# Patient Record
Sex: Female | Born: 1955 | Race: White | Hispanic: No | Marital: Single | Smoking: Former smoker
Health system: Southern US, Community
[De-identification: ages and names within clinical notes are randomized; demographics above are authoritative.]

## PROBLEM LIST (undated history)

## (undated) DIAGNOSIS — G43909 Migraine, unspecified, not intractable, without status migrainosus: Secondary | ICD-10-CM

## (undated) DIAGNOSIS — F419 Anxiety disorder, unspecified: Secondary | ICD-10-CM

## (undated) DIAGNOSIS — D649 Anemia, unspecified: Secondary | ICD-10-CM

## (undated) DIAGNOSIS — T7840XA Allergy, unspecified, initial encounter: Secondary | ICD-10-CM

## (undated) DIAGNOSIS — G4730 Sleep apnea, unspecified: Secondary | ICD-10-CM

## (undated) DIAGNOSIS — M797 Fibromyalgia: Secondary | ICD-10-CM

## (undated) DIAGNOSIS — K8020 Calculus of gallbladder without cholecystitis without obstruction: Secondary | ICD-10-CM

## (undated) DIAGNOSIS — M810 Age-related osteoporosis without current pathological fracture: Secondary | ICD-10-CM

## (undated) DIAGNOSIS — M1990 Unspecified osteoarthritis, unspecified site: Secondary | ICD-10-CM

## (undated) DIAGNOSIS — K589 Irritable bowel syndrome without diarrhea: Secondary | ICD-10-CM

## (undated) DIAGNOSIS — C801 Malignant (primary) neoplasm, unspecified: Secondary | ICD-10-CM

## (undated) DIAGNOSIS — E7800 Pure hypercholesterolemia, unspecified: Secondary | ICD-10-CM

## (undated) DIAGNOSIS — K602 Anal fissure, unspecified: Secondary | ICD-10-CM

## (undated) DIAGNOSIS — E785 Hyperlipidemia, unspecified: Secondary | ICD-10-CM

## (undated) HISTORY — DX: Age-related osteoporosis without current pathological fracture: M81.0

## (undated) HISTORY — DX: Fibromyalgia: M79.7

## (undated) HISTORY — DX: Anemia, unspecified: D64.9

## (undated) HISTORY — DX: Anxiety disorder, unspecified: F41.9

## (undated) HISTORY — PX: TOE SURGERY: SHX1073

## (undated) HISTORY — PX: CYSTECTOMY: SUR359

## (undated) HISTORY — DX: Sleep apnea, unspecified: G47.30

## (undated) HISTORY — DX: Migraine, unspecified, not intractable, without status migrainosus: G43.909

## (undated) HISTORY — DX: Malignant (primary) neoplasm, unspecified: C80.1

## (undated) HISTORY — DX: Unspecified osteoarthritis, unspecified site: M19.90

## (undated) HISTORY — DX: Allergy, unspecified, initial encounter: T78.40XA

## (undated) HISTORY — DX: Anal fissure, unspecified: K60.2

## (undated) HISTORY — DX: Hyperlipidemia, unspecified: E78.5

## (undated) HISTORY — DX: Pure hypercholesterolemia, unspecified: E78.00

## (undated) HISTORY — DX: Irritable bowel syndrome, unspecified: K58.9

## (undated) HISTORY — DX: Calculus of gallbladder without cholecystitis without obstruction: K80.20

---

## 1997-07-13 ENCOUNTER — Ambulatory Visit (HOSPITAL_COMMUNITY): Admission: RE | Admit: 1997-07-13 | Discharge: 1997-07-13 | Payer: Self-pay | Admitting: *Deleted

## 1998-08-22 ENCOUNTER — Encounter: Payer: Self-pay | Admitting: Family Medicine

## 1998-08-22 ENCOUNTER — Ambulatory Visit (HOSPITAL_COMMUNITY): Admission: RE | Admit: 1998-08-22 | Discharge: 1998-08-22 | Payer: Self-pay | Admitting: Family Medicine

## 2003-05-01 ENCOUNTER — Encounter: Admission: RE | Admit: 2003-05-01 | Discharge: 2003-05-01 | Payer: Self-pay | Admitting: Obstetrics and Gynecology

## 2010-03-03 ENCOUNTER — Encounter: Payer: Self-pay | Admitting: Obstetrics and Gynecology

## 2016-05-11 ENCOUNTER — Encounter (HOSPITAL_COMMUNITY): Payer: Self-pay

## 2016-05-11 ENCOUNTER — Emergency Department (HOSPITAL_COMMUNITY)
Admission: EM | Admit: 2016-05-11 | Discharge: 2016-05-11 | Disposition: A | Discharge status: Home / Self Care | Payer: BLUE CROSS/BLUE SHIELD | Attending: Emergency Medicine | Admitting: Emergency Medicine

## 2016-05-11 DIAGNOSIS — Z87891 Personal history of nicotine dependence: Secondary | ICD-10-CM | POA: Diagnosis not present

## 2016-05-11 DIAGNOSIS — H43391 Other vitreous opacities, right eye: Secondary | ICD-10-CM | POA: Diagnosis not present

## 2016-05-11 DIAGNOSIS — H538 Other visual disturbances: Secondary | ICD-10-CM | POA: Diagnosis not present

## 2016-05-11 MED ORDER — TETRACAINE HCL 0.5 % OP SOLN
1.0000 [drp] | Freq: Once | OPHTHALMIC | Status: AC
Start: 2016-05-11 — End: 2016-05-11
  Administered 2016-05-11: 1 [drp] via OPHTHALMIC
  Filled 2016-05-11: qty 4

## 2016-05-11 NOTE — Discharge Instructions (Signed)
Please call tomorrow and schedule a follow-up appointment with Dr. Cathey Endow.

## 2016-05-11 NOTE — ED Provider Notes (Signed)
[facility]-EMERGENCY DEPT Provider Note   CSN: [id number] Arrival date & time: 05/11/16  1743     History   Chief Complaint Chief Complaint  Patient presents with  . Head Injury    hit head in the temp area with a hair dryer 2 days ago.  . eye problems    HPI Tricia Osborne is a 61 y.o. female who presents with constant floaters and intermittent flashes in the right eye that began last night and are worse when she looks upward. She reports the eye is not painful; no HA, diplopia or blurred vision. No alleviating or aggravating factors. She reports that she hit her right forehead with her hair dryer two days ago while drying her hair, but denies any other recent trauma or injury. No changes to the left eye. She wears glasses at baseline. No history of HTN or DM. She is otherwise healthy and takes no daily medications.   HPI  History reviewed. No pertinent past medical history.  There are no active problems to display for this patient.   Past Surgical History:  Procedure Laterality Date  . CYSTECTOMY    . TOE SURGERY      OB History    No data available       Home Medications    Prior to Admission medications   Not on File    Family History Family History  Problem Relation Age of Onset  . Diabetes Mother   . Emphysema Mother   . Cancer Father     Social History Social History  Substance Use Topics  . Smoking status: Former Smoker    Types: Cigarettes  . Smokeless tobacco: Never Used  . Alcohol use Yes     Comment: rarely     Allergies   Penicillins   Review of Systems Review of Systems  Constitutional: Negative for chills and fever.  Eyes: Positive for redness and visual disturbance. Negative for photophobia, pain, discharge and itching.  Skin: Negative for wound.  Allergic/Immunologic: Negative for immunocompromised state.  Neurological: Negative for headaches.   Physical Exam Updated Vital Signs BP 134/80 (BP Location: Left Arm)   Pulse 70    Temp 98.2 F (36.8 C) (Oral)   Resp 18   Ht 5' 2.5" (1.588 m)   Wt 65.3 kg   SpO2 98%   BMI 25.92 kg/m   Physical Exam  Constitutional: She is oriented to person, place, and time. She appears well-developed and well-nourished.  HENT:  Head: Normocephalic and atraumatic.  Eyes: EOM and lids are normal. Pupils are equal, round, and reactive to light. Right eye exhibits no discharge, no exudate and no hordeolum. No foreign body present in the right eye. Left eye exhibits no discharge, no exudate and no hordeolum. No foreign body present in the left eye. Right conjunctiva is injected. Right conjunctiva has no hemorrhage. Left conjunctiva is injected. Left conjunctiva has no hemorrhage. No scleral icterus. Right eye exhibits normal extraocular motion and no nystagmus. Left eye exhibits normal extraocular motion and no nystagmus.  Bilateral conjunctivae with minimal injection.  Bilateral temporal and nasal visual fields intact.   Neck: Normal range of motion.  Cardiovascular: Normal rate, regular rhythm and normal heart sounds.  Exam reveals no gallop and no friction rub.   No murmur heard. Pulmonary/Chest: Effort normal and breath sounds normal. No respiratory distress. She has no wheezes. She has no rales.  Abdominal: Soft.  Musculoskeletal: Normal range of motion.  Neurological: She is alert and oriented  to person, place, and time.  Nursing note and vitals reviewed.  ED Treatments / Results  Labs (all labs ordered are listed, but only abnormal results are displayed) Labs Reviewed - No data to display  EKG  EKG Interpretation None       Radiology No results found.  Procedures Procedures (including critical care time)  Medications Ordered in ED Medications  tetracaine (PONTOCAINE) 0.5 % ophthalmic solution 1-2 drop (1 drop Right Eye Given 05/11/16 2059)   Initial Impression / Assessment and Plan / ED Course  I have reviewed the triage vital signs and the nursing  notes.  Pertinent labs & imaging results that were available during my care of the patient were reviewed by me and considered in my medical decision making (see chart for details).     61 year old patient with new onset of flashes and floaters to the right eye that began last night. The patient reports she has not had her eyes examined in several years and wears reading glasses at baseline. She denies pain to the eyes. No diplopia or blurred vision. Visual acuity without her glasses is 20/200 bilaterally. After applying tetracaine, IOP 18 on the right and 19 on the left. No visual field deficits. Fundoscopic exam is unremarkable. Low suspicion for retinal detachment. Discussed and evaluated the patient with Arthor Captain, PA-C, who agrees with the plan of care at this time. Patient understands to follow up with ophthalmology, especially if new symptoms including a change in vision occur.    Final Clinical Impressions(s) / ED Diagnoses   Final diagnoses:  Floaters in visual field, right    New Prescriptions There are no discharge medications for this patient.    Barkley Boards, PA-C 05/12/16 [id number]    Vanetta Mulders, MD 05/14/16 [phone]

## 2016-05-11 NOTE — ED Triage Notes (Signed)
Patient reports that she hit her right brow area with a hair dryer 2 days ago and is now having floaters in the right eye. Patient denies any pain, N/V, or dizziness.

## 2016-05-12 DIAGNOSIS — H524 Presbyopia: Secondary | ICD-10-CM | POA: Diagnosis not present

## 2016-05-12 DIAGNOSIS — H43811 Vitreous degeneration, right eye: Secondary | ICD-10-CM | POA: Diagnosis not present

## 2016-06-09 DIAGNOSIS — H43811 Vitreous degeneration, right eye: Secondary | ICD-10-CM | POA: Diagnosis not present

## 2017-09-21 DIAGNOSIS — H02845 Edema of left lower eyelid: Secondary | ICD-10-CM | POA: Diagnosis not present

## 2017-09-21 DIAGNOSIS — H02844 Edema of left upper eyelid: Secondary | ICD-10-CM | POA: Diagnosis not present

## 2017-09-28 DIAGNOSIS — H0014 Chalazion left upper eyelid: Secondary | ICD-10-CM | POA: Diagnosis not present

## 2017-10-27 DIAGNOSIS — Z13220 Encounter for screening for lipoid disorders: Secondary | ICD-10-CM | POA: Diagnosis not present

## 2017-10-27 DIAGNOSIS — Z124 Encounter for screening for malignant neoplasm of cervix: Secondary | ICD-10-CM | POA: Diagnosis not present

## 2017-10-27 DIAGNOSIS — Z130 Encounter for screening for diseases of the blood and blood-forming organs and certain disorders involving the immune mechanism: Secondary | ICD-10-CM | POA: Diagnosis not present

## 2017-10-27 DIAGNOSIS — Z0000 Encounter for general adult medical examination without abnormal findings: Secondary | ICD-10-CM | POA: Diagnosis not present

## 2017-10-28 ENCOUNTER — Other Ambulatory Visit: Payer: Self-pay | Admitting: Family Medicine

## 2017-10-28 DIAGNOSIS — Z1231 Encounter for screening mammogram for malignant neoplasm of breast: Secondary | ICD-10-CM

## 2017-12-01 ENCOUNTER — Ambulatory Visit
Admission: RE | Admit: 2017-12-01 | Discharge: 2017-12-01 | Disposition: A | Discharge status: Home / Self Care | Payer: BLUE CROSS/BLUE SHIELD | Source: Ambulatory Visit | Attending: Family Medicine | Admitting: Family Medicine

## 2017-12-01 DIAGNOSIS — Z1231 Encounter for screening mammogram for malignant neoplasm of breast: Secondary | ICD-10-CM

## 2017-12-03 DIAGNOSIS — R319 Hematuria, unspecified: Secondary | ICD-10-CM | POA: Diagnosis not present

## 2017-12-03 DIAGNOSIS — R109 Unspecified abdominal pain: Secondary | ICD-10-CM | POA: Diagnosis not present

## 2017-12-03 DIAGNOSIS — Z1211 Encounter for screening for malignant neoplasm of colon: Secondary | ICD-10-CM | POA: Diagnosis not present

## 2017-12-03 DIAGNOSIS — R0781 Pleurodynia: Secondary | ICD-10-CM | POA: Diagnosis not present

## 2017-12-03 DIAGNOSIS — N390 Urinary tract infection, site not specified: Secondary | ICD-10-CM | POA: Diagnosis not present

## 2018-10-27 DIAGNOSIS — Z1211 Encounter for screening for malignant neoplasm of colon: Secondary | ICD-10-CM | POA: Diagnosis not present

## 2018-10-27 DIAGNOSIS — M7062 Trochanteric bursitis, left hip: Secondary | ICD-10-CM | POA: Diagnosis not present

## 2018-11-16 DIAGNOSIS — B029 Zoster without complications: Secondary | ICD-10-CM | POA: Diagnosis not present

## 2018-12-03 ENCOUNTER — Other Ambulatory Visit: Payer: Self-pay | Admitting: Family Medicine

## 2018-12-03 DIAGNOSIS — Z0000 Encounter for general adult medical examination without abnormal findings: Secondary | ICD-10-CM | POA: Diagnosis not present

## 2018-12-03 DIAGNOSIS — Z1231 Encounter for screening mammogram for malignant neoplasm of breast: Secondary | ICD-10-CM

## 2018-12-27 DIAGNOSIS — Z13220 Encounter for screening for lipoid disorders: Secondary | ICD-10-CM | POA: Diagnosis not present

## 2018-12-27 DIAGNOSIS — Z0000 Encounter for general adult medical examination without abnormal findings: Secondary | ICD-10-CM | POA: Diagnosis not present

## 2018-12-28 DIAGNOSIS — Z1211 Encounter for screening for malignant neoplasm of colon: Secondary | ICD-10-CM | POA: Diagnosis not present

## 2019-01-25 ENCOUNTER — Ambulatory Visit: Payer: BLUE CROSS/BLUE SHIELD

## 2019-02-09 ENCOUNTER — Other Ambulatory Visit: Payer: Self-pay

## 2019-02-09 ENCOUNTER — Ambulatory Visit
Admission: RE | Admit: 2019-02-09 | Discharge: 2019-02-09 | Disposition: A | Discharge status: Home / Self Care | Payer: BC Managed Care – PPO | Source: Ambulatory Visit | Attending: Family Medicine | Admitting: Family Medicine

## 2019-02-09 DIAGNOSIS — Z1231 Encounter for screening mammogram for malignant neoplasm of breast: Secondary | ICD-10-CM

## 2019-09-16 ENCOUNTER — Other Ambulatory Visit: Payer: Self-pay | Admitting: Family Medicine

## 2019-09-16 DIAGNOSIS — M8580 Other specified disorders of bone density and structure, unspecified site: Secondary | ICD-10-CM

## 2019-12-19 ENCOUNTER — Other Ambulatory Visit: Payer: Self-pay | Admitting: Family Medicine

## 2019-12-19 DIAGNOSIS — Z0000 Encounter for general adult medical examination without abnormal findings: Secondary | ICD-10-CM

## 2019-12-21 ENCOUNTER — Ambulatory Visit
Admission: RE | Admit: 2019-12-21 | Discharge: 2019-12-21 | Disposition: A | Discharge status: Home / Self Care | Payer: 59 | Source: Ambulatory Visit | Attending: Family Medicine | Admitting: Family Medicine

## 2019-12-21 ENCOUNTER — Other Ambulatory Visit: Payer: Self-pay

## 2019-12-21 DIAGNOSIS — M8580 Other specified disorders of bone density and structure, unspecified site: Secondary | ICD-10-CM

## 2020-02-14 ENCOUNTER — Ambulatory Visit: Payer: Self-pay

## 2020-03-20 ENCOUNTER — Ambulatory Visit: Payer: Self-pay

## 2020-11-28 ENCOUNTER — Other Ambulatory Visit: Payer: Self-pay

## 2020-11-28 ENCOUNTER — Ambulatory Visit
Admission: RE | Admit: 2020-11-28 | Discharge: 2020-11-28 | Disposition: A | Discharge status: Home / Self Care | Payer: 59 | Source: Ambulatory Visit | Attending: Family Medicine | Admitting: Family Medicine

## 2020-11-28 DIAGNOSIS — Z0000 Encounter for general adult medical examination without abnormal findings: Secondary | ICD-10-CM

## 2020-12-14 IMAGING — MG DIGITAL SCREENING BILAT W/ TOMO W/ CAD
8 series · 8 of 24 positions shown · non-contrast
Comparison: Previous exam(s).

CLINICAL DATA: Screening.

EXAM:
DIGITAL SCREENING BILATERAL MAMMOGRAM WITH TOMO AND CAD

[L MLO synth-2D]
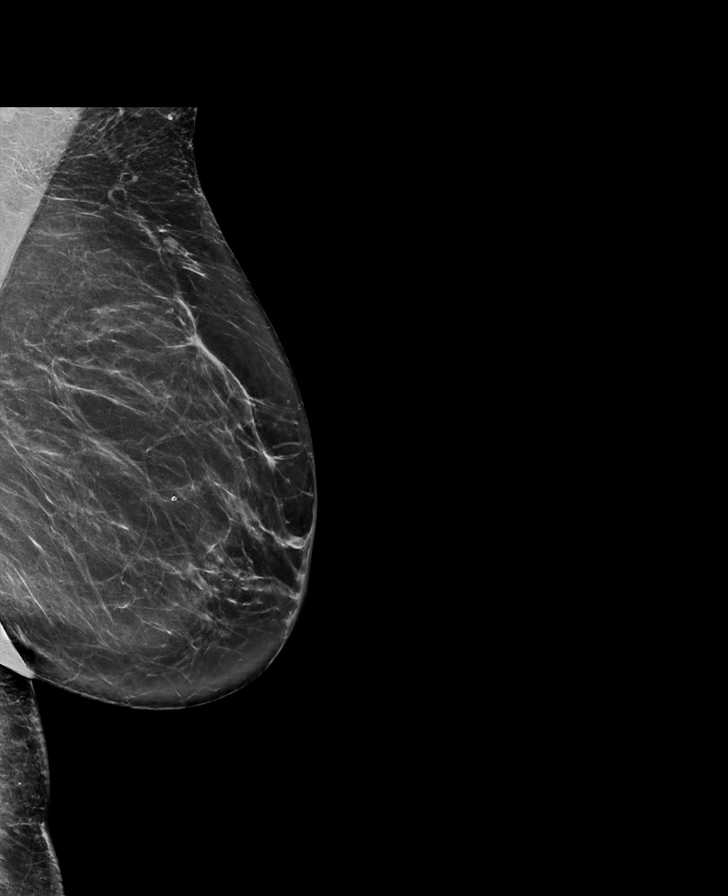

[R CC synth-2D]
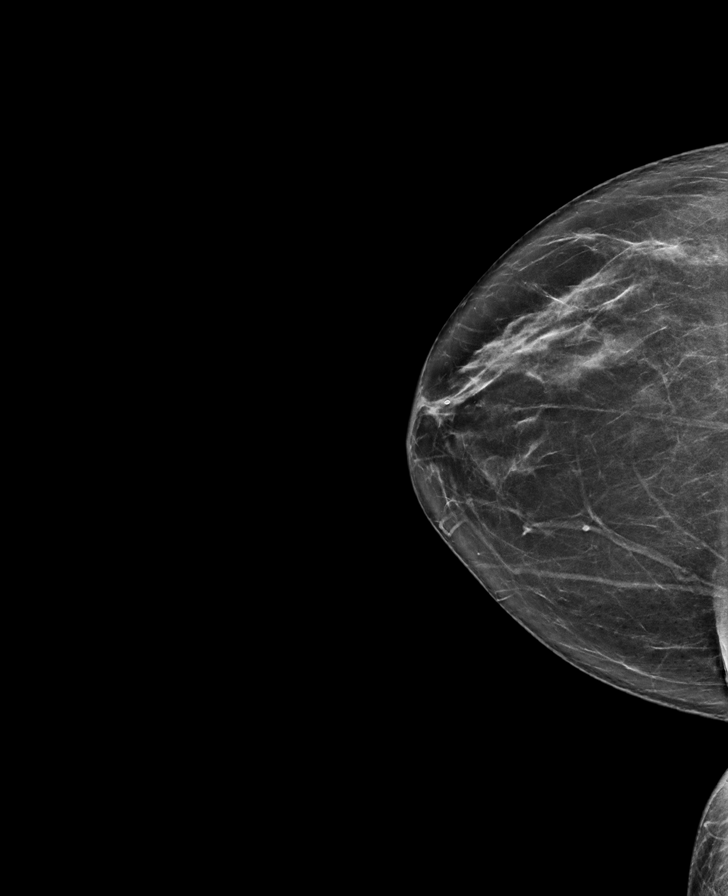

[L CC synth-2D]
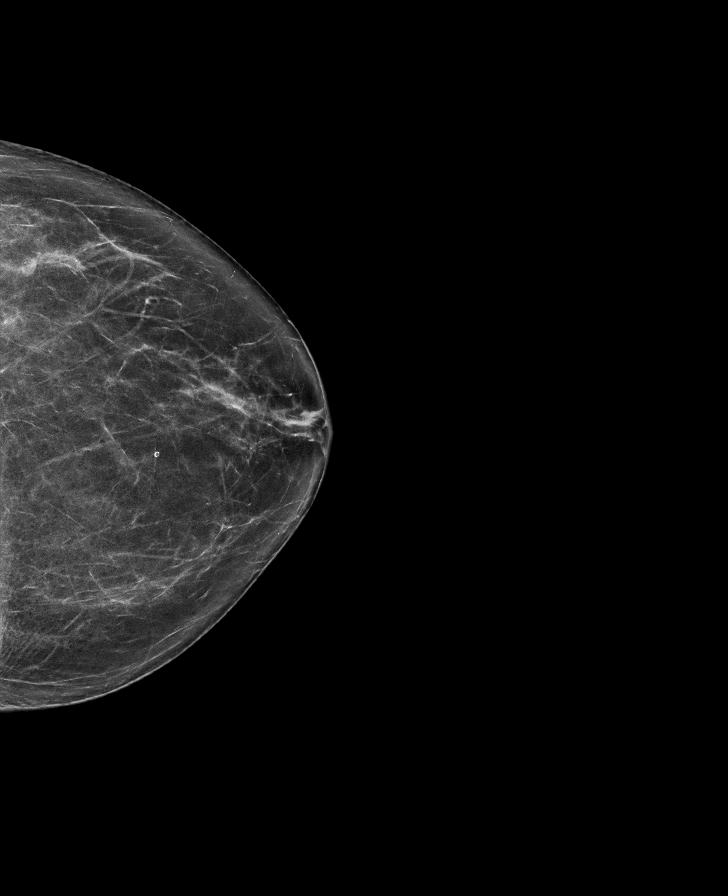

[R MLO synth-2D]
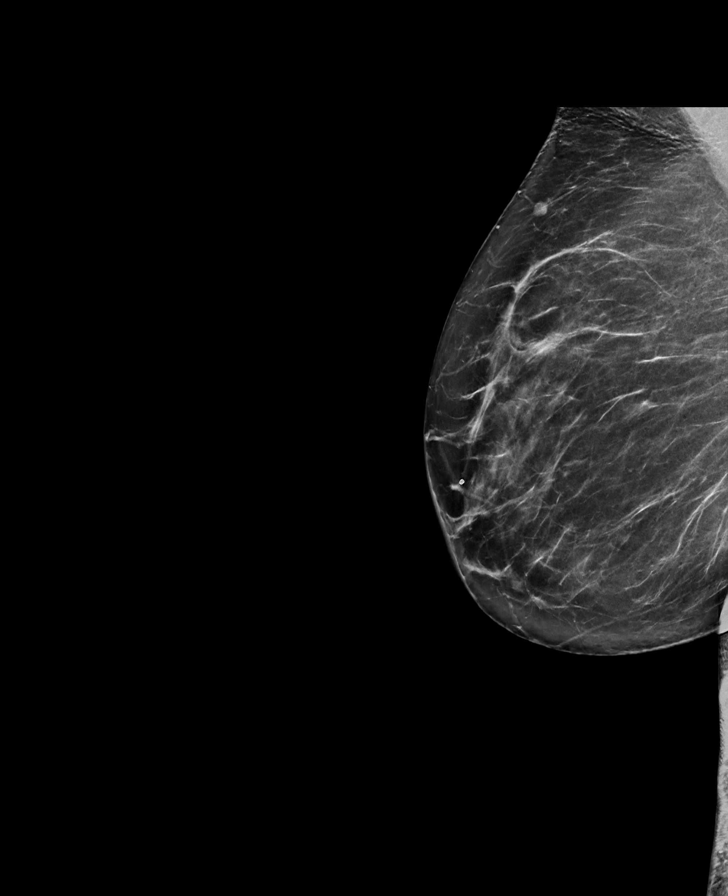

[L MLO tomo · tomo slice 37/74.0]
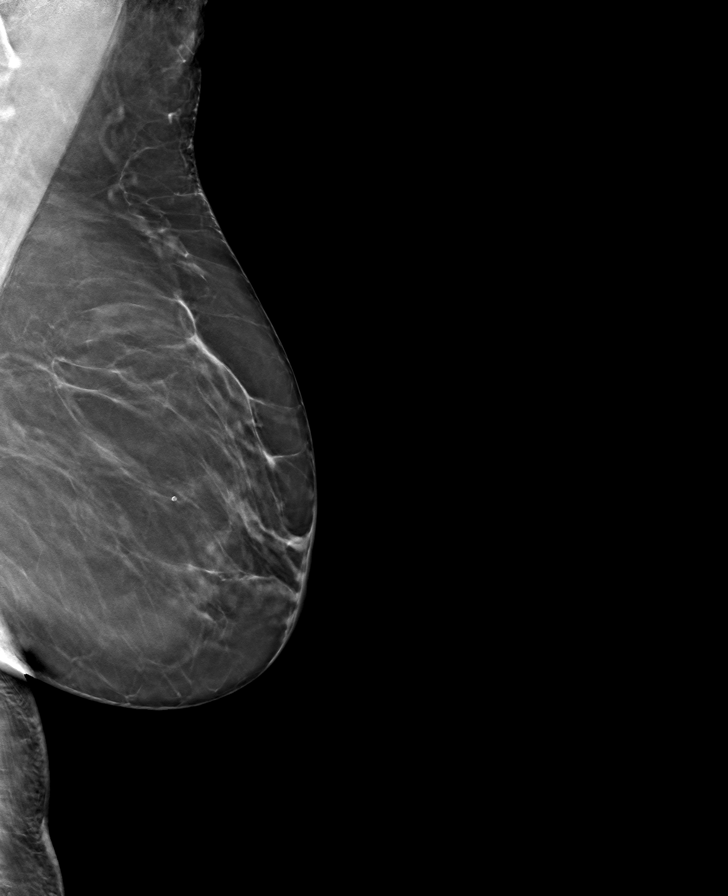

[R MLO tomo · tomo slice 37/73.0]
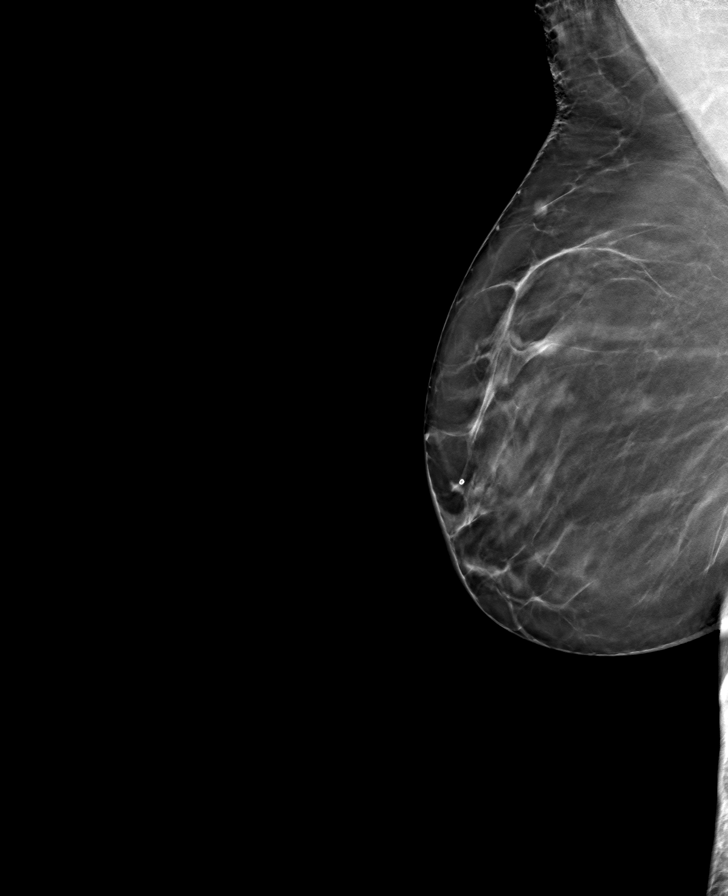

[L CC tomo · tomo slice 35/68.0]
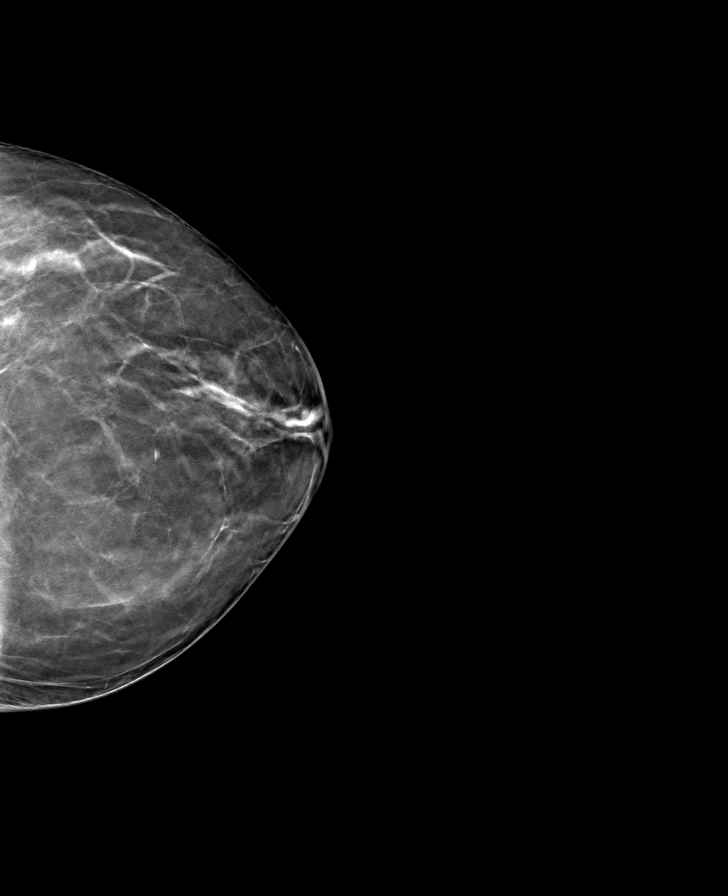

[R CC tomo · tomo slice 35/68.0]
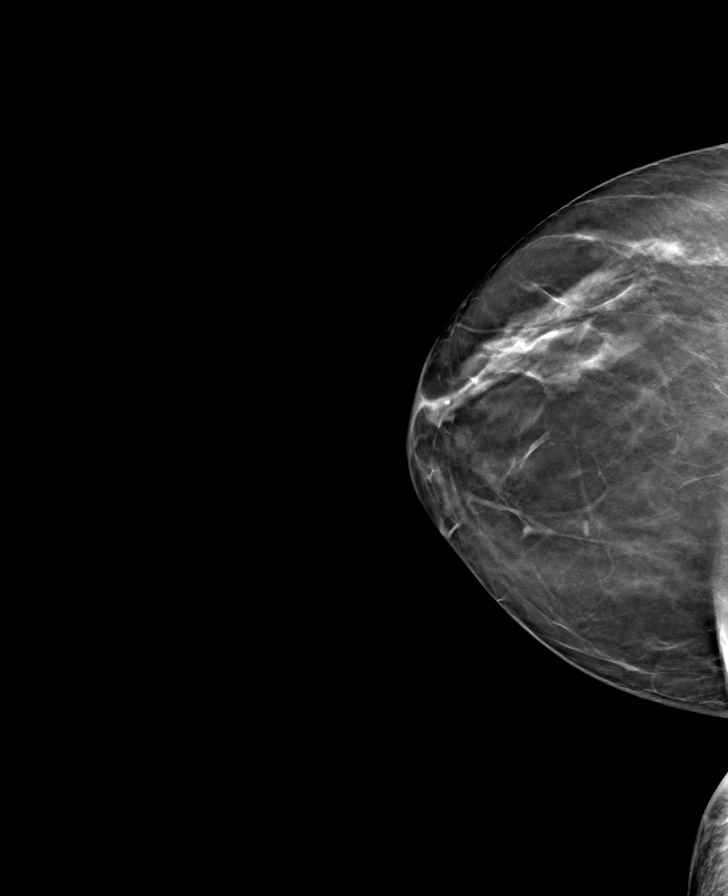

[8 of 24 positions shown; findings below may reference images not displayed]

ACR Breast Density Category c: The breast tissue is heterogeneously
dense, which may obscure small masses.
FINDINGS: There are no findings suspicious for malignancy. Images were
processed with CAD.
IMPRESSION: No mammographic evidence of malignancy. A result letter of this
screening mammogram will be mailed directly to the patient.

RECOMMENDATION:
Screening mammogram in one year. (Code:FT-U-LHB)

BI-RADS CATEGORY  1: Negative.

## 2021-03-08 ENCOUNTER — Other Ambulatory Visit: Payer: Self-pay | Admitting: Family Medicine

## 2021-03-08 DIAGNOSIS — Z1231 Encounter for screening mammogram for malignant neoplasm of breast: Secondary | ICD-10-CM

## 2021-11-29 ENCOUNTER — Ambulatory Visit: Payer: Self-pay

## 2021-12-04 ENCOUNTER — Other Ambulatory Visit: Payer: Self-pay | Admitting: Family Medicine

## 2021-12-04 DIAGNOSIS — Z1211 Encounter for screening for malignant neoplasm of colon: Secondary | ICD-10-CM | POA: Diagnosis not present

## 2021-12-04 DIAGNOSIS — M85852 Other specified disorders of bone density and structure, left thigh: Secondary | ICD-10-CM

## 2021-12-31 ENCOUNTER — Ambulatory Visit: Payer: Self-pay

## 2022-01-01 DIAGNOSIS — M7501 Adhesive capsulitis of right shoulder: Secondary | ICD-10-CM | POA: Diagnosis not present

## 2022-01-08 DIAGNOSIS — M7501 Adhesive capsulitis of right shoulder: Secondary | ICD-10-CM | POA: Diagnosis not present

## 2022-01-14 DIAGNOSIS — M7501 Adhesive capsulitis of right shoulder: Secondary | ICD-10-CM | POA: Diagnosis not present

## 2022-01-15 ENCOUNTER — Ambulatory Visit: Payer: Self-pay

## 2022-01-16 DIAGNOSIS — M7501 Adhesive capsulitis of right shoulder: Secondary | ICD-10-CM | POA: Diagnosis not present

## 2022-01-21 DIAGNOSIS — M7501 Adhesive capsulitis of right shoulder: Secondary | ICD-10-CM | POA: Diagnosis not present

## 2022-01-23 DIAGNOSIS — M7501 Adhesive capsulitis of right shoulder: Secondary | ICD-10-CM | POA: Diagnosis not present

## 2022-01-28 DIAGNOSIS — M7501 Adhesive capsulitis of right shoulder: Secondary | ICD-10-CM | POA: Diagnosis not present

## 2022-01-30 DIAGNOSIS — M7501 Adhesive capsulitis of right shoulder: Secondary | ICD-10-CM | POA: Diagnosis not present

## 2022-02-04 DIAGNOSIS — M7501 Adhesive capsulitis of right shoulder: Secondary | ICD-10-CM | POA: Diagnosis not present

## 2022-02-06 DIAGNOSIS — M7501 Adhesive capsulitis of right shoulder: Secondary | ICD-10-CM | POA: Diagnosis not present

## 2022-02-12 DIAGNOSIS — M7501 Adhesive capsulitis of right shoulder: Secondary | ICD-10-CM | POA: Diagnosis not present

## 2022-03-04 ENCOUNTER — Ambulatory Visit
Admission: RE | Admit: 2022-03-04 | Discharge: 2022-03-04 | Disposition: A | Discharge status: Home / Self Care | Payer: Medicare Other | Source: Ambulatory Visit | Attending: Family Medicine | Admitting: Family Medicine

## 2022-03-04 DIAGNOSIS — Z1231 Encounter for screening mammogram for malignant neoplasm of breast: Secondary | ICD-10-CM | POA: Diagnosis not present

## 2022-03-21 DIAGNOSIS — H9313 Tinnitus, bilateral: Secondary | ICD-10-CM | POA: Diagnosis not present

## 2022-03-21 DIAGNOSIS — H903 Sensorineural hearing loss, bilateral: Secondary | ICD-10-CM | POA: Diagnosis not present

## 2022-04-08 DIAGNOSIS — Z1159 Encounter for screening for other viral diseases: Secondary | ICD-10-CM | POA: Diagnosis not present

## 2022-04-08 DIAGNOSIS — E7800 Pure hypercholesterolemia, unspecified: Secondary | ICD-10-CM | POA: Diagnosis not present

## 2022-04-08 DIAGNOSIS — Z0000 Encounter for general adult medical examination without abnormal findings: Secondary | ICD-10-CM | POA: Diagnosis not present

## 2022-05-21 ENCOUNTER — Ambulatory Visit
Admission: RE | Admit: 2022-05-21 | Discharge: 2022-05-21 | Disposition: A | Discharge status: Home / Self Care | Payer: Medicare Other | Source: Ambulatory Visit | Attending: Family Medicine | Admitting: Family Medicine

## 2022-05-21 DIAGNOSIS — M8589 Other specified disorders of bone density and structure, multiple sites: Secondary | ICD-10-CM | POA: Diagnosis not present

## 2022-05-21 DIAGNOSIS — M85852 Other specified disorders of bone density and structure, left thigh: Secondary | ICD-10-CM

## 2022-05-21 DIAGNOSIS — Z780 Asymptomatic menopausal state: Secondary | ICD-10-CM | POA: Diagnosis not present

## 2022-10-20 DIAGNOSIS — H5203 Hypermetropia, bilateral: Secondary | ICD-10-CM | POA: Diagnosis not present

## 2022-10-20 DIAGNOSIS — H2513 Age-related nuclear cataract, bilateral: Secondary | ICD-10-CM | POA: Diagnosis not present

## 2023-01-14 DIAGNOSIS — Z1211 Encounter for screening for malignant neoplasm of colon: Secondary | ICD-10-CM | POA: Diagnosis not present

## 2023-01-19 ENCOUNTER — Encounter: Payer: Self-pay | Admitting: Physician Assistant

## 2023-02-02 ENCOUNTER — Other Ambulatory Visit: Payer: Self-pay

## 2023-02-02 NOTE — Addendum Note (Signed)
Addended by: Jovita Kussmaul L on: 02/02/2023 11:53 AM   Modules accepted: Orders

## 2023-02-17 ENCOUNTER — Ambulatory Visit: Payer: Medicare Other | Admitting: Internal Medicine

## 2023-02-17 ENCOUNTER — Encounter: Payer: Self-pay | Admitting: Internal Medicine

## 2023-02-17 ENCOUNTER — Other Ambulatory Visit: Payer: Self-pay | Admitting: Internal Medicine

## 2023-02-17 VITALS — BP 120/70 | HR 84 | Ht 62.5 in | Wt 160.1 lb

## 2023-02-17 DIAGNOSIS — Z8719 Personal history of other diseases of the digestive system: Secondary | ICD-10-CM | POA: Diagnosis not present

## 2023-02-17 DIAGNOSIS — K9049 Malabsorption due to intolerance, not elsewhere classified: Secondary | ICD-10-CM | POA: Diagnosis not present

## 2023-02-17 DIAGNOSIS — Z1231 Encounter for screening mammogram for malignant neoplasm of breast: Secondary | ICD-10-CM

## 2023-02-17 DIAGNOSIS — R195 Other fecal abnormalities: Secondary | ICD-10-CM

## 2023-02-17 MED ORDER — NA SULFATE-K SULFATE-MG SULF 17.5-3.13-1.6 GM/177ML PO SOLN: 1.0000 | Freq: Once | ORAL | 0 refills | Status: AC

## 2023-02-17 NOTE — Progress Notes (Signed)
 Patient ID: Tricia Osborne, female   DOB: 1955-02-23, 68 y.o.   MRN: [id number] HPI: Discussed the use of AI scribe software for clinical note transcription with the patient, who gave verbal consent to proceed.  History of Present Illness   Tricia Osborne is a 68 year old female with a remote history of fibromyalgia, chronic fatigue syndrome, anemia, and irritable bowel syndrome (IBS), presenting with a recent positive fecal immunochemical test (FIT). This is the first positive result after years of negative FIT screenings. The patient has never had a colonoscopy before and has been hesitant due to concerns about the prep process, particularly fasting, which can trigger hypoglycemia and migraines.  The patient has a history of IBS, which was diagnosed approximately 25 years ago, following a period of high stress and heavy NSAID use for fibromyalgia pain. Since then, the patient has avoided NSAIDs and manages pain with Boswellia, a natural anti-inflammatory. The patient also has multiple food intolerances, including gluten and possibly dairy, which can cause diarrhea if consumed. The patient follows a gluten-free diet and has not been tested for celiac disease.  The patient has not observed any blood in her stool and reports no current GI symptoms, such as pain or indigestion. However, she does experience occasional diarrhea, which she attributes to accidental consumption of gluten or dairy. The patient is seeking a new primary care provider and is considering scheduling a colonoscopy due to the positive FIT result.        Past Medical History:  Diagnosis Date   Anal fissure    Anemia    Anxiety    Arthritis    Fibromyalgia    Gallstones    HLD (hyperlipidemia)    IBS (irritable bowel syndrome)    Migraines    Pure hypercholesterolemia    Sleep apnea     Past Surgical History:  Procedure Laterality Date   CYSTECTOMY Left    shoulder   TOE SURGERY Bilateral     Outpatient Medications  Prior to Visit  Medication Sig Dispense Refill   B Complex Vitamins (B COMPLEX 1 PO) 1 capsule Orally once a week     Boswellia 307 MG TABS Boswellia     Cholecalciferol (VITAMIN D -3) 125 MCG (5000 UT) TABS 1000 IU Orally once a week     Cranberry 400 MG TABS 1 capsule Orally occasionally     L-Lysine HCl POWD 1/4 tsp Orally Once a day in a shake     Magnesium Oxide -Mg Supplement ([employer] MAGNESIUM) 500 MG TABS 1 tablet with a meal Orally Three times a day     Misc Natural Products (ELDERBERRY IMMUNE COMPLEX) CHEW 500 mg two cspsules Orally twice a day     Olive Leaf Extract 500 MG CAPS 1 capsule Orally twice a day     Omega-3 Fatty Acids (FISH OIL) 1000 MG CAPS 1 capsule Orally Once a day     Pediatric Multivitamins-Fl (MULTIVITAMIN + FLUORIDE) 0.25 MG CHEW 1 tablet by mouth Once daily     No facility-administered medications prior to visit.    Allergies  Allergen Reactions   Acetaminophen     Other Reaction(s): Messes her stomach up   Latex Itching   Monosodium Glutamate     Headaches   Penicillins     Family History  Problem Relation Age of Onset   Diabetes Mother    Emphysema Mother    Kidney failure Mother    Brain cancer Father    Uterine cancer Sister  Diabetes Sister    Heart disease Sister    Diabetes Maternal Grandmother    Breast cancer Neg Hx     Social History   Tobacco Use   Smoking status: Former    Current packs/day: 0.00    Types: Cigarettes    Quit date: 1996    Years since quitting: 29.0   Smokeless tobacco: Never  Vaping Use   Vaping status: Never Used  Substance Use Topics   Alcohol use: Yes    Comment: rarely   Drug use: No    ROS: As per history of present illness, otherwise negative  BP 120/70 (BP Location: Left Arm, Patient Position: Sitting, Cuff Size: Normal)   Pulse 84   Ht 5' 2.5 (1.588 m)   Wt 160 lb 2 oz (72.6 kg)   BMI 28.82 kg/m  Gen: awake, alert, NAD HEENT: anicteric  CV: RRR, no mrg Pulm: CTA b/l Abd: soft,  NT/ND, +BS throughout Ext: no c/c/e Neuro: nonfocal   RELEVANT LABS AND IMAGING: Results   LABS FIT: positive (01/17/2023)      ASSESSMENT/PLAN: Assessment and Plan    Positive Fecal Immunochemical Test (FIT) First positive FIT after years of negative results. No history of colonoscopy. No current GI symptoms or visible blood in stool. Discussed the need for colonoscopy despite patient's concerns about prep and fasting due to hypoglycemia and potential for migraines.  The nature of the procedure, as well as the risks, benefits, and alternatives were carefully and thoroughly reviewed with the patient. Ample time for discussion and questions allowed. The patient understood, was satisfied, and agreed to proceed.  -Schedule colonoscopy. -Use Suprep for bowel preparation. -Provide patient with instructions for prep and clear liquid diet day before procedure.  History of Irritable Bowel Syndrome (IBS) Stable with no current symptoms. History of NSAID use causing gut issues, currently managed with Boswellia and dietary modifications. -Continue current management.  Food Intolerances Gluten and potentially dairy intolerance. No known diagnosis of celiac disease. -Continue current dietary modifications.      Cc:Sun, Vyvyan, Md [address] [place],  [place] [address]

## 2023-02-17 NOTE — Patient Instructions (Signed)
 You have been scheduled for a colonoscopy. Please follow written instructions given to you at your visit today.   Please pick up your prep supplies at the pharmacy within the next 1-3 days.  If you use inhalers (even only as needed), please bring them with you on the day of your procedure.  DO NOT TAKE 7 DAYS PRIOR TO TEST- Trulicity (dulaglutide) Ozempic, Wegovy (semaglutide) Mounjaro (tirzepatide) Bydureon Bcise (exanatide extended release)  DO NOT TAKE 1 DAY PRIOR TO YOUR TEST Rybelsus (semaglutide) Adlyxin (lixisenatide) Victoza (liraglutide) Byetta (exanatide) ___________________________________________________________________________   If your blood pressure at your visit was 140/90 or greater, please contact your primary care physician to follow up on this.  _______________________________________________________  If you are age 42 or older, your body mass index should be between 23-30. Your Body mass index is 28.82 kg/m. If this is out of the aforementioned range listed, please consider follow up with your Primary Care Provider.  If you are age 70 or younger, your body mass index should be between 19-25. Your Body mass index is 28.82 kg/m. If this is out of the aformentioned range listed, please consider follow up with your Primary Care Provider.   ________________________________________________________  The [facility] providers would like to encourage you to use MYCHART to communicate with providers for non-urgent requests or questions.  Due to long hold times on the telephone, sending your provider a message by [facility] may be a faster and more efficient way to get a response.  Please allow 48 business hours for a response.  Please remember that this is for non-urgent requests.  _______________________________________________________

## 2023-03-02 ENCOUNTER — Encounter: Payer: Self-pay | Admitting: Internal Medicine

## 2023-03-03 ENCOUNTER — Encounter: Payer: Self-pay | Admitting: Internal Medicine

## 2023-03-09 ENCOUNTER — Ambulatory Visit: Payer: Medicare Other | Admitting: Internal Medicine

## 2023-03-09 ENCOUNTER — Encounter: Payer: Self-pay | Admitting: Internal Medicine

## 2023-03-09 VITALS — BP 142/75 | HR 67 | Temp 98.6°F | Resp 12 | Ht 62.5 in | Wt 160.2 lb

## 2023-03-09 DIAGNOSIS — Z1211 Encounter for screening for malignant neoplasm of colon: Secondary | ICD-10-CM

## 2023-03-09 DIAGNOSIS — R195 Other fecal abnormalities: Secondary | ICD-10-CM

## 2023-03-09 DIAGNOSIS — D122 Benign neoplasm of ascending colon: Secondary | ICD-10-CM | POA: Diagnosis not present

## 2023-03-09 DIAGNOSIS — M797 Fibromyalgia: Secondary | ICD-10-CM | POA: Diagnosis not present

## 2023-03-09 MED ORDER — SODIUM CHLORIDE 0.9 % IV SOLN: 500.0000 mL | INTRAVENOUS | Status: DC

## 2023-03-09 NOTE — Progress Notes (Signed)
See office note dated 02/17/2023 for details and current H&P  Patient presenting to the [facility] for colonoscopy to evaluate positive fit  She remains appropriate for colonoscopy here today.

## 2023-03-09 NOTE — Progress Notes (Signed)
Called to procedure room to assist with colonoscopy.

## 2023-03-09 NOTE — Op Note (Signed)
[name] Procedure Date: 03/09/2023 2:03 PM MRN: [id number] Endoscopist: Beverley Fiedler , MD, [id number] Age: 68 Referring MD:  Date of Birth: 1955-04-29 Gender: Female Account #: [id number] Procedure:                Colonoscopy Indications:              This is the patient's first colonoscopy, Positive                            fecal immunochemical test Medicines:                Monitored Anesthesia Care Procedure:                Pre-Anesthesia Assessment:                           - Prior to the procedure, a History and Physical                            was performed, and patient medications and                            allergies were reviewed. The patient's tolerance of                            previous anesthesia was also reviewed. The risks                            and benefits of the procedure and the sedation                            options and risks were discussed with the patient.                            All questions were answered, and informed consent                            was obtained. Prior Anticoagulants: The patient has                            taken no anticoagulant or antiplatelet agents. ASA                            Grade Assessment: II - A patient with mild systemic                            disease. After reviewing the risks and benefits,                            the patient was deemed in satisfactory condition to                            undergo the procedure.  After obtaining informed consent, the colonoscope                            was passed under direct vision. Throughout the                            procedure, the patient's blood pressure, pulse, and                            oxygen saturations were monitored continuously. The                            Olympus Scope SN [id number] was introduced through the                            anus and advanced to the terminal  ileum. The                            colonoscopy was performed without difficulty. The                            patient tolerated the procedure well. The quality                            of the bowel preparation was excellent. The                            terminal ileum, ileocecal valve, appendiceal                            orifice, and rectum were photographed. Scope In: 2:27:09 PM Scope Out: 2:42:02 PM Scope Withdrawal Time: 0 hours 11 minutes 52 seconds  Total Procedure Duration: 0 hours 14 minutes 53 seconds  Findings:                 The digital rectal exam was normal.                           Two sessile polyps were found in the ascending                            colon. The polyps were 4 to 6 mm in size. These                            polyps were removed with a cold snare. Resection                            and retrieval were complete.                           The exam was otherwise without abnormality on                            direct and retroflexion views. Complications:  No immediate complications. Estimated Blood Loss:     Estimated blood loss: none. Impression:               - Two 4 to 6 mm polyps in the ascending colon,                            removed with a cold snare. Resected and retrieved.                           - The examination was otherwise normal on direct                            and retroflexion views. Recommendation:           - Patient has a contact number available for                            emergencies. The signs and symptoms of potential                            delayed complications were discussed with the                            patient. Return to normal activities tomorrow.                            Written discharge instructions were provided to the                            patient.                           - Resume previous diet.                           - Continue present medications.                            - Await pathology results.                           - Repeat colonoscopy is recommended. The                            colonoscopy date will be determined after pathology                            results from today's exam become available for                            review. Beverley Fiedler, MD 03/09/2023 2:44:17 PM This report has been signed electronically.

## 2023-03-09 NOTE — Patient Instructions (Signed)
Discharge instructions given. Handout on polyps. Resume previous medications. YOU HAD AN ENDOSCOPIC PROCEDURE TODAY AT THE [facility]:   Refer to the procedure report that was given to you for any specific questions about what was found during the examination.  If the procedure report does not answer your questions, please call your gastroenterologist to clarify.  If you requested that your care partner not be given the details of your procedure findings, then the procedure report has been included in a sealed envelope for you to review at your convenience later.  YOU SHOULD EXPECT: Some feelings of bloating in the abdomen. Passage of more gas than usual.  Walking can help get rid of the air that was put into your GI tract during the procedure and reduce the bloating. If you had a lower endoscopy (such as a colonoscopy or flexible sigmoidoscopy) you may notice spotting of blood in your stool or on the toilet paper. If you underwent a bowel prep for your procedure, you may not have a normal bowel movement for a few days.  Please Note:  You might notice some irritation and congestion in your nose or some drainage.  This is from the oxygen used during your procedure.  There is no need for concern and it should clear up in a day or so.  SYMPTOMS TO REPORT IMMEDIATELY:  Following lower endoscopy (colonoscopy or flexible sigmoidoscopy):  Excessive amounts of blood in the stool  Significant tenderness or worsening of abdominal pains  Swelling of the abdomen that is new, acute  Fever of 100F or higher   For urgent or emergent issues, a gastroenterologist can be reached at any hour by calling [phone]. Do not use MyChart messaging for urgent concerns.    DIET:  We do recommend a small meal at first, but then you may proceed to your regular diet.  Drink plenty of fluids but you should avoid alcoholic beverages for 24 hours.  ACTIVITY:  You should plan to take it easy for the rest  of today and you should NOT DRIVE or use heavy machinery until tomorrow (because of the sedation medicines used during the test).    FOLLOW UP: Our staff will call the number listed on your records the next business day following your procedure.  We will call around 7:15- 8:00 am to check on you and address any questions or concerns that you may have regarding the information given to you following your procedure. If we do not reach you, we will leave a message.     If any biopsies were taken you will be contacted by phone or by letter within the next 1-3 weeks.  Please call us at [phone] if you have not heard about the biopsies in 3 weeks.    SIGNATURES/CONFIDENTIALITY: You and/or your care partner have signed paperwork which will be entered into your electronic medical record.  These signatures attest to the fact that that the information above on your After Visit Summary has been reviewed and is understood.  Full responsibility of the confidentiality of this discharge information lies with you and/or your care-partner.

## 2023-03-09 NOTE — Progress Notes (Signed)
Pt resting comfortably. VSS. Airway intact. SBAR complete to RN. All questions answered.

## 2023-03-10 ENCOUNTER — Telehealth: Payer: Self-pay | Admitting: Internal Medicine

## 2023-03-10 ENCOUNTER — Telehealth: Payer: Self-pay

## 2023-03-10 NOTE — Telephone Encounter (Signed)
  Follow up Call-     03/09/2023    1:23 PM  Call back number  Post procedure Call Back phone  # [phone]  Permission to leave phone message Yes     Patient questions:  Do you have a fever, pain , or abdominal swelling? No. Pain Score  0 *  Have you tolerated food without any problems? Yes.    Have you been able to return to your normal activities? Yes.    Do you have any questions about your discharge instructions: Diet   No. Medications  No. Follow up visit  No.  Do you have questions or concerns about your Care? No.  Actions: * If pain score is 4 or above: No action needed, pain <4.

## 2023-03-10 NOTE — Telephone Encounter (Signed)
Copied from CRM [id number]. Topic: General - Other >> Mar 10, 2023 10:51 AM Truddie Crumble wrote: Reason for CRM: patient called wanting to make sure her advance care planning is in her chart for her doctors to see. Patient stated that she does not see it in mychart. Patient has a new patient appointment on 7/7  and she was told if she had an acute problem then she will be able to be seen for that problem before her new patient appointment. Pt would like to be called  ----  I did not see advanced plan of care pw in pt's file, can you double check for me please?

## 2023-03-12 ENCOUNTER — Encounter: Payer: Self-pay | Admitting: Internal Medicine

## 2023-03-12 LAB — SURGICAL PATHOLOGY

## 2023-03-13 ENCOUNTER — Encounter: Payer: Self-pay | Admitting: Internal Medicine

## 2023-03-17 ENCOUNTER — Ambulatory Visit
Admission: RE | Admit: 2023-03-17 | Discharge: 2023-03-17 | Disposition: A | Discharge status: Home / Self Care | Payer: Medicare Other | Source: Ambulatory Visit | Attending: Internal Medicine | Admitting: Internal Medicine

## 2023-03-17 DIAGNOSIS — Z1231 Encounter for screening mammogram for malignant neoplasm of breast: Secondary | ICD-10-CM | POA: Diagnosis not present

## 2023-03-20 ENCOUNTER — Other Ambulatory Visit: Payer: Self-pay | Admitting: Internal Medicine

## 2023-03-20 DIAGNOSIS — R928 Other abnormal and inconclusive findings on diagnostic imaging of breast: Secondary | ICD-10-CM

## 2023-04-02 ENCOUNTER — Ambulatory Visit: Payer: Medicare Other | Admitting: Physician Assistant

## 2023-06-02 DIAGNOSIS — H903 Sensorineural hearing loss, bilateral: Secondary | ICD-10-CM | POA: Diagnosis not present

## 2023-06-04 ENCOUNTER — Other Ambulatory Visit: Payer: Self-pay | Admitting: Internal Medicine

## 2023-06-04 ENCOUNTER — Ambulatory Visit
Admission: RE | Admit: 2023-06-04 | Discharge: 2023-06-04 | Disposition: A | Discharge status: Home / Self Care | Source: Ambulatory Visit | Attending: Internal Medicine | Admitting: Internal Medicine

## 2023-06-04 DIAGNOSIS — R920 Mammographic microcalcification found on diagnostic imaging of breast: Secondary | ICD-10-CM | POA: Diagnosis not present

## 2023-06-04 DIAGNOSIS — R928 Other abnormal and inconclusive findings on diagnostic imaging of breast: Secondary | ICD-10-CM

## 2023-06-04 DIAGNOSIS — R921 Mammographic calcification found on diagnostic imaging of breast: Secondary | ICD-10-CM

## 2023-07-13 ENCOUNTER — Ambulatory Visit
Admission: RE | Admit: 2023-07-13 | Discharge: 2023-07-13 | Disposition: A | Discharge status: Home / Self Care | Source: Ambulatory Visit | Attending: Internal Medicine | Admitting: Internal Medicine

## 2023-07-13 DIAGNOSIS — R921 Mammographic calcification found on diagnostic imaging of breast: Secondary | ICD-10-CM

## 2023-07-13 DIAGNOSIS — R92322 Mammographic fibroglandular density, left breast: Secondary | ICD-10-CM | POA: Diagnosis not present

## 2023-07-13 DIAGNOSIS — N6322 Unspecified lump in the left breast, upper inner quadrant: Secondary | ICD-10-CM | POA: Diagnosis not present

## 2023-07-13 DIAGNOSIS — D0512 Intraductal carcinoma in situ of left breast: Secondary | ICD-10-CM | POA: Diagnosis not present

## 2023-07-13 HISTORY — PX: BREAST BIOPSY: SHX20

## 2023-07-14 LAB — SURGICAL PATHOLOGY

## 2023-07-22 ENCOUNTER — Encounter: Payer: Self-pay | Admitting: Internal Medicine

## 2023-08-17 ENCOUNTER — Ambulatory Visit: Payer: Medicare Other | Admitting: Internal Medicine

## 2023-08-17 ENCOUNTER — Telehealth: Payer: Self-pay

## 2023-08-17 ENCOUNTER — Encounter: Payer: Self-pay | Admitting: Internal Medicine

## 2023-08-17 VITALS — BP 118/80 | HR 72 | Temp 98.2°F | Ht 60.25 in | Wt 154.0 lb

## 2023-08-17 DIAGNOSIS — D0512 Intraductal carcinoma in situ of left breast: Secondary | ICD-10-CM

## 2023-08-17 NOTE — Patient Instructions (Signed)
 We will bring you back for the physical.

## 2023-08-17 NOTE — Assessment & Plan Note (Signed)
 We discussed biopsy results and standard

## 2023-08-17 NOTE — Telephone Encounter (Signed)
 Copied from CRM [id number]. Topic: General - Other >> Aug 17, 2023  1:34 PM Martinique E wrote: Reason for CRM: Patient was told to fast for her physical on August 5th and then received a MyChart message saying she does not have to fast. Patient wants confirmation if she has to fast or not for that visit. Callback number [phone].

## 2023-08-17 NOTE — Progress Notes (Unsigned)
   Subjective:   Patient ID: Tricia Osborne, female    DOB: 1956-01-16, 68 y.o.   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 She has been doing a lot of research and has an idea of how she wants to proceed with this. Prior testing abnormal with naturopath whom she sees regularly. Would like with upcoming labs. Copper and zinc and vitamin d and b12 and Hga1c and thyroid and tk1  PMH, FMH, social history reviewed and updated  Review of Systems  Constitutional: Negative.   HENT: Negative.    Eyes: Negative.   Respiratory:  Negative for cough, chest tightness and shortness of breath.   Cardiovascular:  Negative for chest pain, palpitations and leg swelling.  Gastrointestinal:  Negative for abdominal distention, abdominal pain, constipation, diarrhea, nausea and vomiting.  Musculoskeletal: Negative.   Skin: Negative.   Neurological: Negative.   Psychiatric/Behavioral: Negative.      Objective:  Physical Exam Constitutional:      Appearance: She is well-developed.  HENT:     Head: Normocephalic and atraumatic.  Cardiovascular:     Rate and Rhythm: Normal rate and regular rhythm.  Pulmonary:     Effort: Pulmonary effort is normal. No respiratory distress.     Breath sounds: Normal breath sounds. No wheezing or rales.  Abdominal:     General: Bowel sounds are normal. There is no distension.     Palpations: Abdomen is soft.     Tenderness: There is no abdominal tenderness. There is no rebound.  Musculoskeletal:     Cervical back: Normal range of motion.  Skin:    General: Skin is warm and dry.  Neurological:     Mental Status: She is alert and oriented to person, place, and time.     Coordination: Coordination normal.     Vitals:   08/17/23 0818  BP: 118/80  Pulse: 72  Temp: 98.2 F (36.8 C)  TempSrc: Oral  SpO2: 98%  Weight: 154 lb (69.9 kg)  Height: 5' 0.25 (1.53 m)    Assessment & Plan:  Visit time 40 minutes in  face to face communication with patient and coordination of care, additional 25 minutes spent in record review, coordination or care, ordering tests, communicating/referring to other healthcare professionals, documenting in medical records all on the same day of the visit for total time 65 minutes spent on the visit.

## 2023-09-15 ENCOUNTER — Ambulatory Visit: Admitting: Internal Medicine

## 2023-09-15 ENCOUNTER — Ambulatory Visit: Payer: Self-pay | Admitting: Internal Medicine

## 2023-09-15 ENCOUNTER — Encounter: Payer: Self-pay | Admitting: Internal Medicine

## 2023-09-15 VITALS — BP 126/80 | HR 77 | Temp 98.2°F | Ht 60.25 in | Wt 151.0 lb

## 2023-09-15 DIAGNOSIS — Z13220 Encounter for screening for lipoid disorders: Secondary | ICD-10-CM

## 2023-09-15 DIAGNOSIS — R5382 Chronic fatigue, unspecified: Secondary | ICD-10-CM

## 2023-09-15 DIAGNOSIS — E559 Vitamin D deficiency, unspecified: Secondary | ICD-10-CM | POA: Diagnosis not present

## 2023-09-15 DIAGNOSIS — Z0000 Encounter for general adult medical examination without abnormal findings: Secondary | ICD-10-CM

## 2023-09-15 LAB — LIPID PANEL
Cholesterol: 272 mg/dL — ABNORMAL HIGH (ref 0–200)
HDL: 61.8 mg/dL (ref >39.00)
LDL Cholesterol: 195 mg/dL — ABNORMAL HIGH (ref 0–99)
NonHDL: 209.97
Total CHOL/HDL Ratio: 4
Triglycerides: 76 mg/dL (ref 0.0–149.0)
VLDL: 15.2 mg/dL (ref 0.0–40.0)

## 2023-09-15 LAB — CBC
HCT: 43.5 % (ref 36.0–46.0)
Hemoglobin: 14.6 g/dL (ref 12.0–15.0)
MCHC: 33.6 g/dL (ref 30.0–36.0)
MCV: 91.1 fl (ref 78.0–100.0)
Platelets: 223 K/uL (ref 150.0–400.0)
RBC: 4.77 Mil/uL (ref 3.87–5.11)
RDW: 13.5 % (ref 11.5–15.5)
WBC: 6.1 K/uL (ref 4.0–10.5)

## 2023-09-15 LAB — VITAMIN B12: Vitamin B-12: >1500 pg/mL — ABNORMAL HIGH (ref 211–911)

## 2023-09-15 LAB — COMPREHENSIVE METABOLIC PANEL WITH GFR
ALT: 18 U/L (ref 0–35)
AST: 20 U/L (ref 0–37)
Albumin: 4.7 g/dL (ref 3.5–5.2)
Alkaline Phosphatase: 57 U/L (ref 39–117)
BUN: 20 mg/dL (ref 6–23)
CO2: 28 meq/L (ref 19–32)
Calcium: 10.1 mg/dL (ref 8.4–10.5)
Chloride: 100 meq/L (ref 96–112)
Creatinine, Ser: 0.68 mg/dL (ref 0.40–1.20)
GFR: 90.41 mL/min (ref >60.00)
Glucose, Bld: 99 mg/dL (ref 70–99)
Potassium: 4.4 meq/L (ref 3.5–5.1)
Sodium: 136 meq/L (ref 135–145)
Total Bilirubin: 0.7 mg/dL (ref 0.2–1.2)
Total Protein: 7.7 g/dL (ref 6.0–8.3)

## 2023-09-15 LAB — HEMOGLOBIN A1C: Hgb A1c MFr Bld: 5.5 % (ref 4.6–6.5)

## 2023-09-15 LAB — FERRITIN: Ferritin: 41.3 ng/mL (ref 10.0–291.0)

## 2023-09-15 LAB — VITAMIN D 25 HYDROXY (VIT D DEFICIENCY, FRACTURES): VITD: 63.5 ng/mL (ref 30.00–100.00)

## 2023-09-15 NOTE — Progress Notes (Unsigned)
 Subjective:   Patient ID: Tricia Osborne, female    DOB: [birth year], 68 y.o.   MRN: [id number]  The patient is here for physical. Pertinent topics discussed: Discussed the use of AI scribe software for clinical note transcription with the patient, who gave verbal consent to proceed.  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  She manages her DCIS with lifestyle modifications, including walking, yoga, and free weights three times a week. She has also started practicing Qigong to manage anxiety related to her diagnosis and participates in DCIS support groups. She is concerned about the potential risks associated with biopsies, particularly the possibility of 'seeding' cancer cells.  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 No new chest pain or breathing difficulties in the past month.  She has a history of arthritis, which occasionally causes achiness in her hip, especially after overexertion. However, she maintains full mobility and finds that staying active helps manage her symptoms.  She has experienced some hearing loss, as noted in a recent hearing test, but it does not significantly impact her daily life. She reports sensitivity to loud noises but does not require any hearing assistance at this time.  No new gastrointestinal symptoms such as diarrhea, constipation, heartburn, or abdominal pain. No new skin changes, lumps, or bumps.  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 She attributes the reduction in migraines to decreased stress levels.  She has not smoked cigarettes and has significantly reduced her alcohol intake since January, following a 'dry January' initiative. She has also adjusted her diet to reduce high histamine foods and processed meats, focusing more on  plant-based options and fish.  She has a history of chronic fatigue syndrome and reports that her body tends to react adversely to vaccines, which has made her cautious about receiving them. She experienced increased fatigue following COVID-19 vaccinations. Here for medicare wellness and physical, no new complaints. Please see A/P for status and treatment of chronic medical problems.   Diet: heart healthy Physical activity: active with yoga, walking at least 4 times a week Depression/mood screen: negative Hearing: intact to whispered voice, mild loss hearing test earlier this year Visual acuity: grossly normal with lens, performs annual eye exam  ADLs: capable Fall risk: none Home safety: good Cognitive evaluation: intact to orientation, naming, recall and repetition EOL planning: adv directives discussed  Flowsheet Row Office Visit from 08/17/2023 in [facility] [place]  PHQ-2 Total Score 0        08/17/2023    8:26 AM  Fall Risk  Falls in the past year? 0  Was there an injury with Fall? 0  Fall Risk Category Calculator 0  Fall risk Follow up Falls evaluation completed   I have personally reviewed and have noted 1. The patient's medical and social history - reviewed today no changes 2. Their use of alcohol, tobacco or illicit drugs 3. Their current medications and supplements 4. The patient's functional ability including ADL's, fall risks, home safety risks and hearing or visual impairment. 5. Diet and physical activities 6. Evidence for depression or mood disorders 7. Care team reviewed and updated 8.  The patient is not on an opioid pain medication.  Patient Care Team: Rollene Almarie LABOR, MD as PCP - General (Internal Medicine) Past Medical  History:  Diagnosis Date   Allergy    Anal fissure    Anemia    Anxiety    Arthritis    Cancer (HCC) 2025 biopsy   DCIS, not cancer, precancer   Fibromyalgia    Gallstones    HLD (hyperlipidemia)     IBS (irritable bowel syndrome)    Migraines    Osteoporosis    Pure hypercholesterolemia    Sleep apnea    Past Surgical History:  Procedure Laterality Date   BREAST BIOPSY Left 07/13/2023   MM LT BREAST BX W LOC DEV 1ST LESION IMAGE BX SPEC STEREO GUIDE 07/13/2023 [facility]   CYSTECTOMY Left    shoulder   TOE SURGERY Bilateral    Family History  Problem Relation Age of Onset   Diabetes Mother    Emphysema Mother    Kidney failure Mother    Anxiety disorder Mother    Arthritis Mother    Brain cancer Father    Alcohol abuse Father    Cancer Father    Uterine cancer Sister    Alcohol abuse Sister    Diabetes Sister    Heart disease Sister    Hypertension Sister    Diabetes Maternal Grandmother    Breast cancer Neg Hx    Colon cancer Neg Hx    Colon polyps Neg Hx    Rectal cancer Neg Hx    Stomach cancer Neg Hx     Review of Systems  Constitutional: Negative.   HENT: Negative.    Eyes: Negative.   Respiratory:  Negative for cough, chest tightness and shortness of breath.   Cardiovascular:  Negative for chest pain, palpitations and leg swelling.  Gastrointestinal:  Negative for abdominal distention, abdominal pain, constipation, diarrhea, nausea and vomiting.  Musculoskeletal: Negative.   Skin: Negative.   Neurological: Negative.   Psychiatric/Behavioral: Negative.      Objective:  Physical Exam Constitutional:      Appearance: She is well-developed.  HENT:     Head: Normocephalic and atraumatic.  Cardiovascular:     Rate and Rhythm: Normal rate and regular rhythm.  Pulmonary:     Effort: Pulmonary effort is normal. No respiratory distress.     Breath sounds: Normal breath sounds. No wheezing or rales.  Abdominal:     General: Bowel sounds are normal. There is no distension.     Palpations: Abdomen is soft.     Tenderness: There is no abdominal tenderness. There is no rebound.  Musculoskeletal:     Cervical back: Normal range of motion.  Skin:     General: Skin is warm and dry.  Neurological:     Mental Status: She is alert and oriented to person, place, and time.     Coordination: Coordination normal.     Vitals:   09/15/23 0842  BP: 126/80  Pulse: 77  Temp: 98.2 F (36.8 C)  TempSrc: Oral  SpO2: 96%  Weight: 151 lb (68.5 kg)  Height: 5' 0.25 (1.53 m)    Assessment & Plan:

## 2023-09-17 DIAGNOSIS — Z0000 Encounter for general adult medical examination without abnormal findings: Secondary | ICD-10-CM | POA: Insufficient documentation

## 2023-09-17 DIAGNOSIS — E559 Vitamin D deficiency, unspecified: Secondary | ICD-10-CM | POA: Insufficient documentation

## 2023-09-17 DIAGNOSIS — R5382 Chronic fatigue, unspecified: Secondary | ICD-10-CM | POA: Insufficient documentation

## 2023-09-17 NOTE — Assessment & Plan Note (Signed)
 Flu shot counseled. Pneumonia counseled. Shingrix counseled. Tetanus up to date. Colonoscopy up to date. Mammogram up to date, pap smear aged out and dexa up to date. Counseled about sun safety and mole surveillance. Counseled about the dangers of distracted driving. Given 10 year screening recommendations.

## 2023-09-17 NOTE — Assessment & Plan Note (Signed)
 Checking labs for metabolic cause.

## 2023-09-18 ENCOUNTER — Encounter: Payer: Self-pay | Admitting: Internal Medicine

## 2023-10-21 DIAGNOSIS — H5203 Hypermetropia, bilateral: Secondary | ICD-10-CM | POA: Diagnosis not present

## 2023-10-21 DIAGNOSIS — H04123 Dry eye syndrome of bilateral lacrimal glands: Secondary | ICD-10-CM | POA: Diagnosis not present

## 2023-10-21 DIAGNOSIS — H2513 Age-related nuclear cataract, bilateral: Secondary | ICD-10-CM | POA: Diagnosis not present

## 2024-09-21 ENCOUNTER — Encounter: Admitting: Internal Medicine
